# Patient Record
Sex: Male | Born: 1980 | Race: Black or African American | Hispanic: No | Marital: Single | Smoking: Never smoker
Health system: Southern US, Community
[De-identification: ages and names within clinical notes are randomized; demographics above are authoritative.]

---
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
## 2011-10-30 ENCOUNTER — Emergency Department (HOSPITAL_COMMUNITY): Payer: No Typology Code available for payment source

## 2011-10-30 ENCOUNTER — Emergency Department (HOSPITAL_COMMUNITY)
Admission: EM | Admit: 2011-10-30 | Discharge: 2011-10-30 | Disposition: A | Discharge status: Home / Self Care | Payer: No Typology Code available for payment source | Attending: Emergency Medicine | Admitting: Emergency Medicine

## 2011-10-30 ENCOUNTER — Encounter (HOSPITAL_COMMUNITY): Payer: Self-pay | Admitting: Emergency Medicine

## 2011-10-30 DIAGNOSIS — M545 Low back pain, unspecified: Secondary | ICD-10-CM | POA: Insufficient documentation

## 2011-10-30 DIAGNOSIS — M542 Cervicalgia: Secondary | ICD-10-CM | POA: Insufficient documentation

## 2011-10-30 DIAGNOSIS — R51 Headache: Secondary | ICD-10-CM | POA: Insufficient documentation

## 2011-10-30 DIAGNOSIS — M546 Pain in thoracic spine: Secondary | ICD-10-CM | POA: Insufficient documentation

## 2011-10-30 DIAGNOSIS — Y92410 Unspecified street and highway as the place of occurrence of the external cause: Secondary | ICD-10-CM | POA: Insufficient documentation

## 2011-10-30 MED ORDER — METHOCARBAMOL 500 MG PO TABS: 500.0000 mg | ORAL_TABLET | Freq: Two times a day (BID) | ORAL | Status: DC

## 2011-10-30 MED ORDER — IBUPROFEN 800 MG PO TABS: 800.0000 mg | ORAL_TABLET | Freq: Three times a day (TID) | ORAL | Status: AC

## 2011-10-30 MED ORDER — IBUPROFEN 800 MG PO TABS: 800.0000 mg | ORAL_TABLET | Freq: Three times a day (TID) | ORAL | Status: DC

## 2011-10-30 MED ORDER — IBUPROFEN 400 MG PO TABS
800.0000 mg | ORAL_TABLET | Freq: Once | ORAL | Status: AC
  Administered 2011-10-30: 800 mg via ORAL
  Filled 2011-10-30: qty 2

## 2011-10-30 MED ORDER — OXYCODONE-ACETAMINOPHEN 5-325 MG PO TABS: 2.0000 | ORAL_TABLET | ORAL | Status: DC | PRN

## 2011-10-30 MED ORDER — METHOCARBAMOL 500 MG PO TABS: 500.0000 mg | ORAL_TABLET | Freq: Two times a day (BID) | ORAL | Status: AC

## 2011-10-30 MED ORDER — OXYCODONE-ACETAMINOPHEN 5-325 MG PO TABS
1.0000 | ORAL_TABLET | Freq: Once | ORAL | Status: AC
  Administered 2011-10-30: 1 via ORAL
  Filled 2011-10-30: qty 1

## 2011-10-30 MED ORDER — OXYCODONE-ACETAMINOPHEN 5-325 MG PO TABS: 2.0000 | ORAL_TABLET | ORAL | Status: AC | PRN
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
## 2011-10-30 NOTE — ED Provider Notes (Signed)
4:45 PM patient alert ambulatory without difficulty Glasgow Coma Score 15 not lightheaded on standing feels improved after treatment in the emergency department head exam shows no evidence of trauma. Radiologic reports reviewed patient discharged in stable condition  Doug Sou, MD 10/30/11 1649
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
## 2011-10-31 NOTE — ED Provider Notes (Signed)
Medical screening examination/treatment/procedure(s) were performed by non-physician practitioner and as supervising physician I was immediately available for consultation/collaboration.   Sheyli Horwitz, MD 10/31/11 1433 

## 2013-12-24 ENCOUNTER — Emergency Department (HOSPITAL_COMMUNITY): Payer: Medicaid Other

## 2013-12-24 ENCOUNTER — Emergency Department (HOSPITAL_COMMUNITY)
Admission: EM | Admit: 2013-12-24 | Discharge: 2013-12-24 | Disposition: A | Discharge status: Home / Self Care | Payer: Medicaid Other | Attending: Emergency Medicine | Admitting: Emergency Medicine

## 2013-12-24 ENCOUNTER — Encounter (HOSPITAL_COMMUNITY): Payer: Self-pay | Admitting: Emergency Medicine

## 2013-12-24 DIAGNOSIS — R413 Other amnesia: Secondary | ICD-10-CM | POA: Diagnosis not present

## 2013-12-24 DIAGNOSIS — Y92410 Unspecified street and highway as the place of occurrence of the external cause: Secondary | ICD-10-CM | POA: Diagnosis not present

## 2013-12-24 DIAGNOSIS — Y9355 Activity, bike riding: Secondary | ICD-10-CM | POA: Insufficient documentation

## 2013-12-24 DIAGNOSIS — R55 Syncope and collapse: Secondary | ICD-10-CM | POA: Diagnosis present

## 2013-12-24 DIAGNOSIS — S025XXA Fracture of tooth (traumatic), initial encounter for closed fracture: Secondary | ICD-10-CM | POA: Diagnosis not present

## 2013-12-24 LAB — COMPREHENSIVE METABOLIC PANEL
ALBUMIN: 4.4 g/dL (ref 3.5–5.2)
ALT: 20 U/L (ref 0–53)
AST: 31 U/L (ref 0–37)
Alkaline Phosphatase: 84 U/L (ref 39–117)
Anion gap: 13 (ref 5–15)
BUN: 11 mg/dL (ref 6–23)
CALCIUM: 9.2 mg/dL (ref 8.4–10.5)
CO2: 23 mEq/L (ref 19–32)
Chloride: 104 mEq/L (ref 96–112)
Creatinine, Ser: 1.03 mg/dL (ref 0.50–1.35)
GFR calc non Af Amer: >90 mL/min (ref >90)
GLUCOSE: 124 mg/dL — AB (ref 70–99)
POTASSIUM: 4 meq/L (ref 3.7–5.3)
SODIUM: 140 meq/L (ref 137–147)
TOTAL PROTEIN: 7.4 g/dL (ref 6.0–8.3)
Total Bilirubin: 0.5 mg/dL (ref 0.3–1.2)

## 2013-12-24 LAB — CBC WITH DIFFERENTIAL/PLATELET
BASOS PCT: 0 % (ref 0–1)
Basophils Absolute: 0 10*3/uL (ref 0.0–0.1)
EOS PCT: 3 % (ref 0–5)
Eosinophils Absolute: 0.3 10*3/uL (ref 0.0–0.7)
HCT: 42.1 % (ref 39.0–52.0)
HEMOGLOBIN: 13.9 g/dL (ref 13.0–17.0)
LYMPHS ABS: 2.5 10*3/uL (ref 0.7–4.0)
Lymphocytes Relative: 27 % (ref 12–46)
MCH: 23.9 pg — AB (ref 26.0–34.0)
MCHC: 33 g/dL (ref 30.0–36.0)
MCV: 72.5 fL — AB (ref 78.0–100.0)
MONO ABS: 0.8 10*3/uL (ref 0.1–1.0)
Monocytes Relative: 8 % (ref 3–12)
Neutro Abs: 5.8 10*3/uL (ref 1.7–7.7)
Neutrophils Relative %: 62 % (ref 43–77)
PLATELETS: 247 10*3/uL (ref 150–400)
RBC: 5.81 MIL/uL (ref 4.22–5.81)
RDW: 13.8 % (ref 11.5–15.5)
WBC: 9.4 10*3/uL (ref 4.0–10.5)

## 2013-12-24 LAB — URINALYSIS, ROUTINE W REFLEX MICROSCOPIC
Bilirubin Urine: NEGATIVE
Glucose, UA: NEGATIVE mg/dL
HGB URINE DIPSTICK: NEGATIVE
Ketones, ur: 15 mg/dL — AB
Leukocytes, UA: NEGATIVE
Nitrite: NEGATIVE
PH: 5.5 (ref 5.0–8.0)
Protein, ur: NEGATIVE mg/dL
SPECIFIC GRAVITY, URINE: 1.03 (ref 1.005–1.030)
UROBILINOGEN UA: 0.2 mg/dL (ref 0.0–1.0)

## 2013-12-24 LAB — RAPID URINE DRUG SCREEN, HOSP PERFORMED
AMPHETAMINES: NOT DETECTED
Barbiturates: NOT DETECTED
Benzodiazepines: NOT DETECTED
COCAINE: NOT DETECTED
OPIATES: POSITIVE — AB
Tetrahydrocannabinol: POSITIVE — AB

## 2013-12-24 LAB — ETHANOL

## 2013-12-24 NOTE — ED Notes (Signed)
The patient is unable to give a urine specimen at this time. 

## 2013-12-24 NOTE — ED Notes (Signed)
Patient transported to CT 
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
## 2013-12-24 NOTE — Discharge Instructions (Signed)
CT head and labs were normal.   Follow-up your Dr.

## 2015-07-31 IMAGING — CT CT HEAD W/O CM
1 series · 16 of 29 positions shown, 20 images · non-contrast
Comparison: 10/30/2011.

CLINICAL DATA: Memory loss.  Headache and dizziness.

EXAM:
CT HEAD WITHOUT CONTRAST
TECHNIQUE: Contiguous axial images were obtained from the base of the skull
through the vertex without intravenous contrast.

[Series 2: head 5.0 h30s · axial · 0.43mm/px · z∈[-146,-16]mm · 16 of 29 slices shown, 20 images]
[im 2/29  brain]
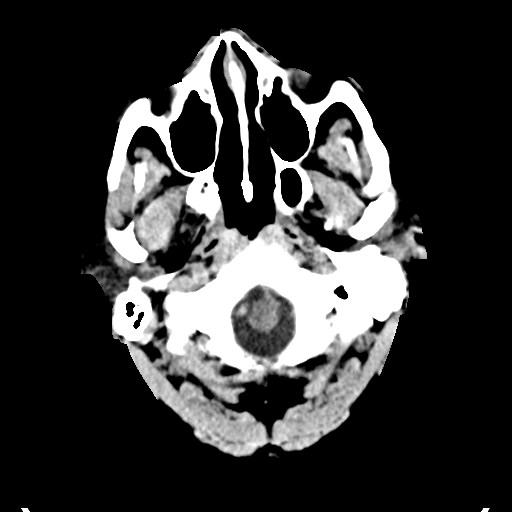
[im 2/29  bone]
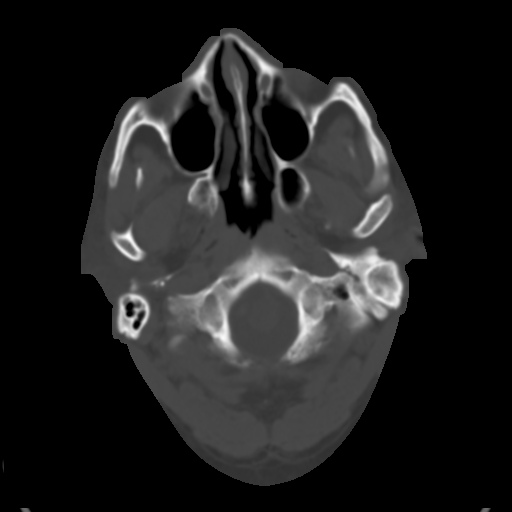
[im 4/29  brain]
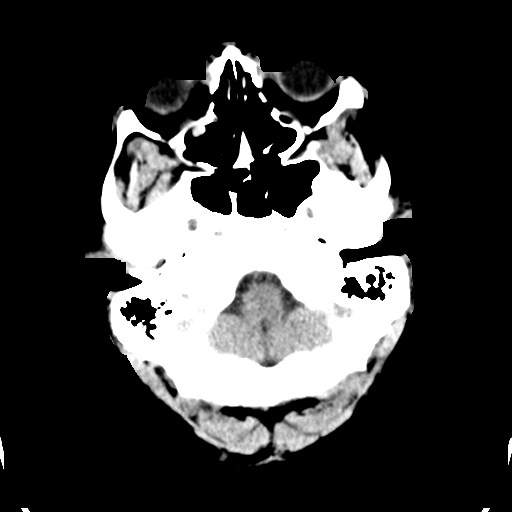
[im 6/29  brain]
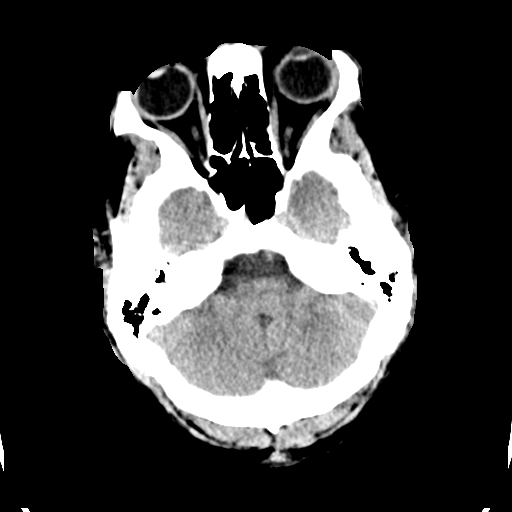
[im 7/29  brain]
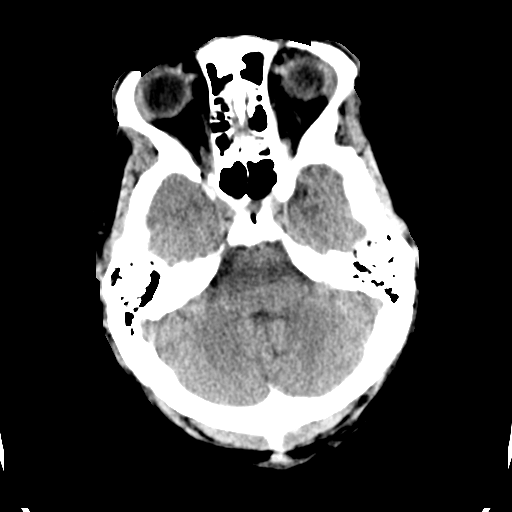
[im 9/29  brain]
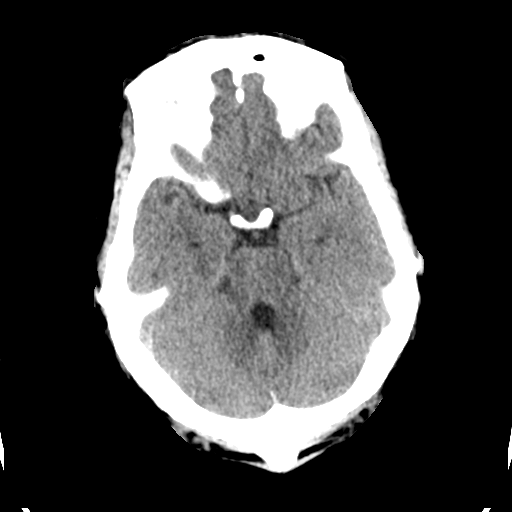
[im 9/29  bone]
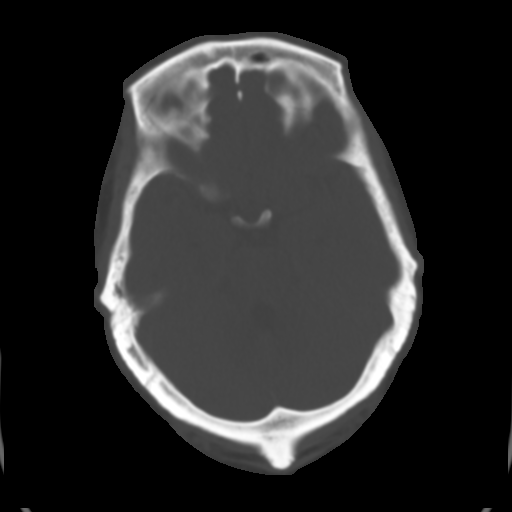
[im 11/29  brain]
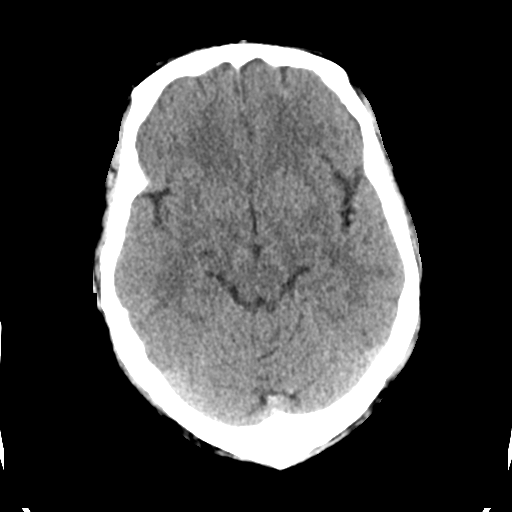
[im 12/29  brain]
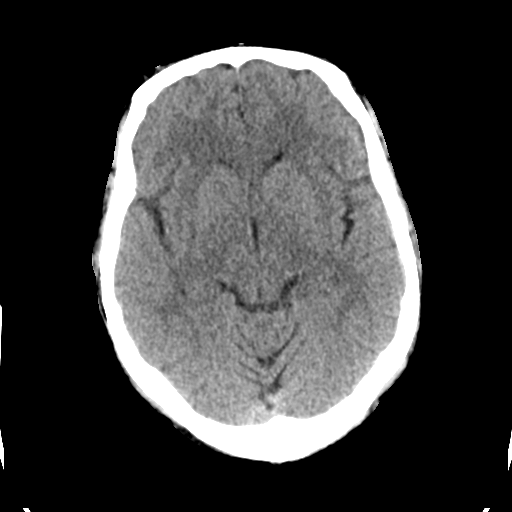
[im 14/29  brain]
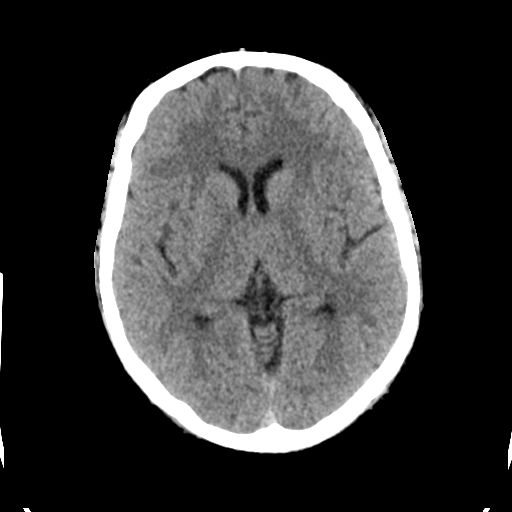
[im 16/29  brain]
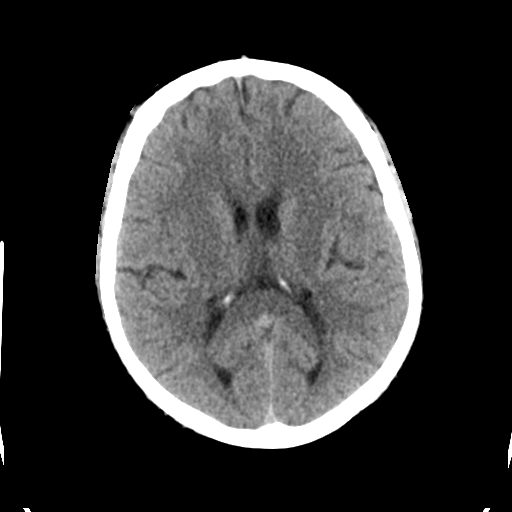
[im 16/29  bone]
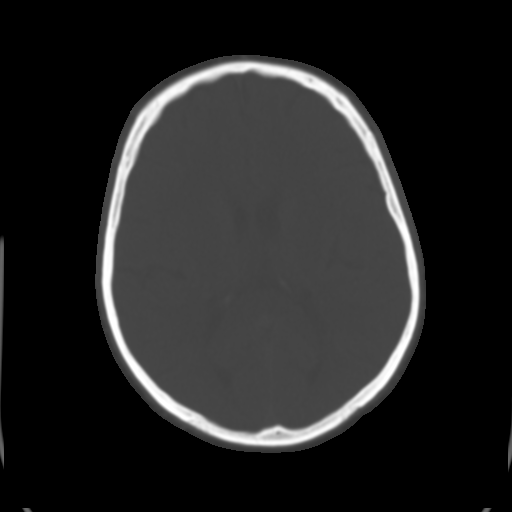
[im 18/29  brain]
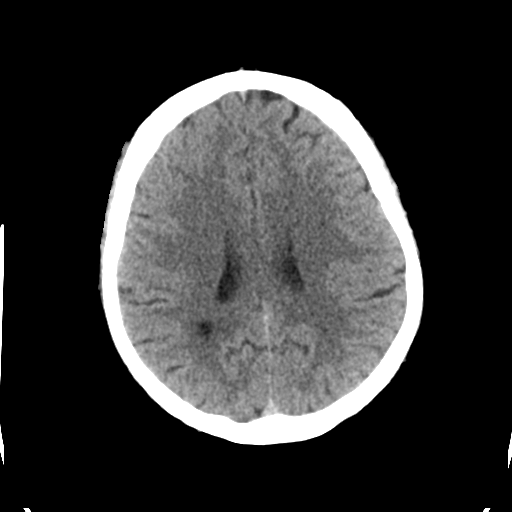
[im 19/29  brain]
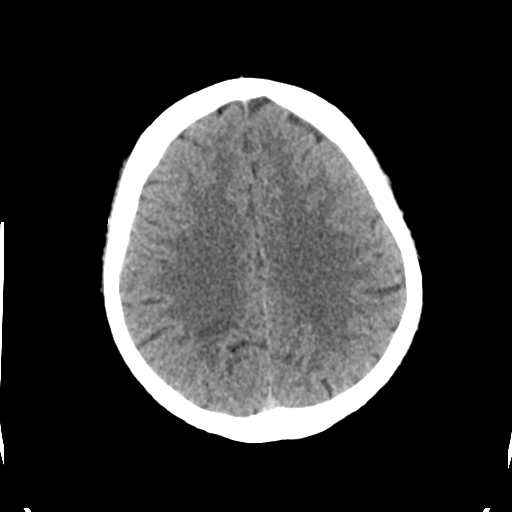
[im 21/29  brain]
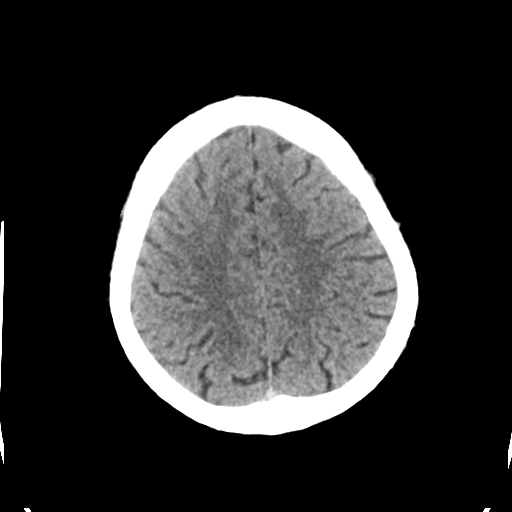
[im 23/29  brain]
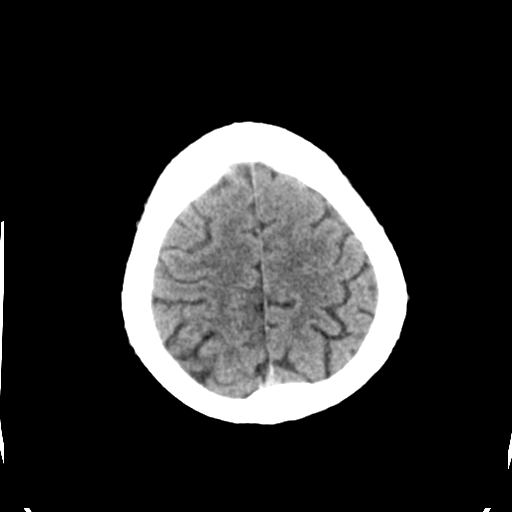
[im 23/29  bone]
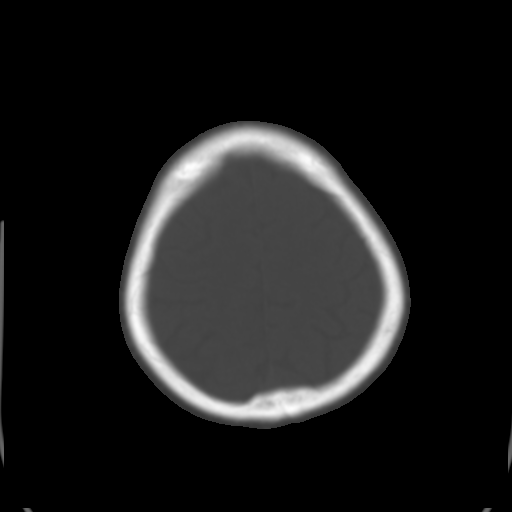
[im 24/29  brain]
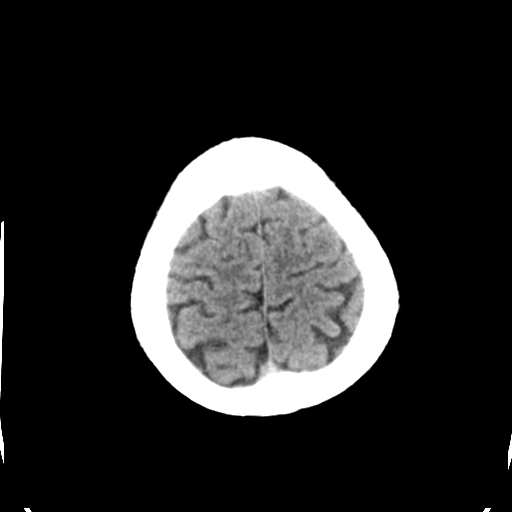
[im 26/29  brain]
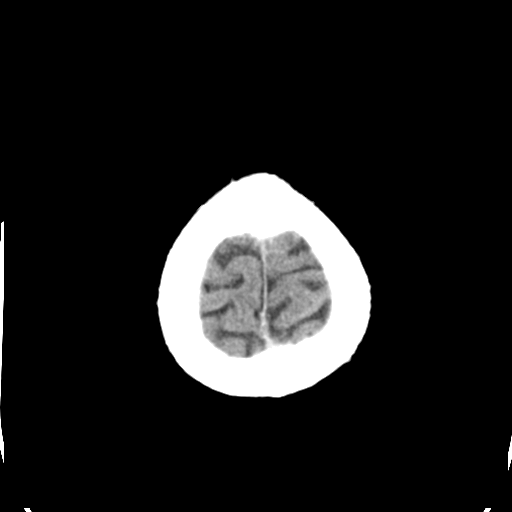
[im 28/29  brain]
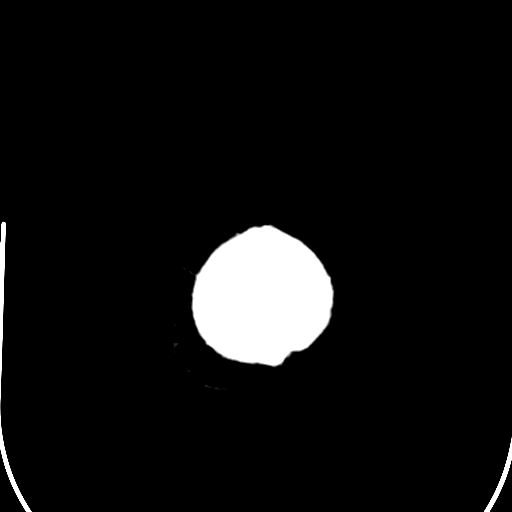

[16 of 29 positions shown; findings below may reference images not displayed]

FINDINGS: Old lacunar infarct noted in the right posterior periventricular
white matter, stable. No acute intracranial abnormality.
Specifically, no hemorrhage, hydrocephalus, mass lesion, acute
infarction, or significant intracranial injury. No acute calvarial
abnormality.

Areas of mucosal thickening in the paranasal sinuses. Old left
medial orbital wall blowout fracture. No air-fluid levels. Mastoid
air cells are clear.
IMPRESSION: No acute intracranial abnormality.
